# Patient Record
Sex: Male | Born: 1990 | Race: Black or African American | Hispanic: No | Marital: Single | Smoking: Current every day smoker
Health system: Southern US, Community
[De-identification: ages and names within clinical notes are randomized; demographics above are authoritative.]

---
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
## 2014-01-26 ENCOUNTER — Emergency Department (HOSPITAL_COMMUNITY): Payer: Self-pay

## 2014-01-26 ENCOUNTER — Emergency Department (HOSPITAL_COMMUNITY)
Admission: EM | Admit: 2014-01-26 | Discharge: 2014-01-26 | Disposition: A | Discharge status: Home / Self Care | Payer: Self-pay | Attending: Emergency Medicine | Admitting: Emergency Medicine

## 2014-01-26 ENCOUNTER — Encounter (HOSPITAL_COMMUNITY): Payer: Self-pay | Admitting: Emergency Medicine

## 2014-01-26 DIAGNOSIS — Y9289 Other specified places as the place of occurrence of the external cause: Secondary | ICD-10-CM | POA: Insufficient documentation

## 2014-01-26 DIAGNOSIS — Y9389 Activity, other specified: Secondary | ICD-10-CM | POA: Insufficient documentation

## 2014-01-26 DIAGNOSIS — S6990XA Unspecified injury of unspecified wrist, hand and finger(s), initial encounter: Secondary | ICD-10-CM | POA: Insufficient documentation

## 2014-01-26 DIAGNOSIS — S60229A Contusion of unspecified hand, initial encounter: Secondary | ICD-10-CM | POA: Insufficient documentation

## 2014-01-26 DIAGNOSIS — K029 Dental caries, unspecified: Secondary | ICD-10-CM | POA: Insufficient documentation

## 2014-01-26 DIAGNOSIS — S60221A Contusion of right hand, initial encounter: Secondary | ICD-10-CM

## 2014-01-26 DIAGNOSIS — W230XXA Caught, crushed, jammed, or pinched between moving objects, initial encounter: Secondary | ICD-10-CM | POA: Insufficient documentation

## 2014-01-26 DIAGNOSIS — F17200 Nicotine dependence, unspecified, uncomplicated: Secondary | ICD-10-CM | POA: Insufficient documentation

## 2014-01-26 MED ORDER — NAPROXEN 375 MG PO TABS: 375.0000 mg | ORAL_TABLET | Freq: Two times a day (BID) | ORAL | Status: DC

## 2014-01-26 MED ORDER — AMOXICILLIN 500 MG PO CAPS: 500.0000 mg | ORAL_CAPSULE | Freq: Three times a day (TID) | ORAL | Status: DC

## 2014-01-26 MED ORDER — ONDANSETRON 4 MG PO TBDP
4.0000 mg | ORAL_TABLET | Freq: Once | ORAL | Status: AC
  Administered 2014-01-26: 4 mg via ORAL
  Filled 2014-01-26: qty 1

## 2014-01-26 MED ORDER — HYDROCODONE-ACETAMINOPHEN 5-325 MG PO TABS
1.0000 | ORAL_TABLET | Freq: Once | ORAL | Status: AC
  Administered 2014-01-26: 1 via ORAL
  Filled 2014-01-26: qty 1

## 2014-01-26 NOTE — ED Notes (Signed)
Pt c/o left hand pain and dental pain. Nad, skin warm and dry, resp e/u.

## 2014-01-26 NOTE — Discharge Instructions (Signed)
Dental Pain A tooth ache may be caused by cavities (tooth decay). Cavities expose the nerve of the tooth to air and hot or cold temperatures. It may come from an infection or abscess (also called a boil or furuncle) around your tooth. It is also often caused by dental caries (tooth decay). This causes the pain you are having. DIAGNOSIS  Your caregiver can diagnose this problem by exam. TREATMENT   If caused by an infection, it may be treated with medications which kill germs (antibiotics) and pain medications as prescribed by your caregiver. Take medications as directed.  Only take over-the-counter or prescription medicines for pain, discomfort, or fever as directed by your caregiver.  Whether the tooth ache today is caused by infection or dental disease, you should see your dentist as soon as possible for further care. SEEK MEDICAL CARE IF: The exam and treatment you received today has been provided on an emergency basis only. This is not a substitute for complete medical or dental care. If your problem worsens or new problems (symptoms) appear, and you are unable to meet with your dentist, call or return to this location. SEEK IMMEDIATE MEDICAL CARE IF:   You have a fever.  You develop redness and swelling of your face, jaw, or neck.  You are unable to open your mouth.  You have severe pain uncontrolled by pain medicine. MAKE SURE YOU:   Understand these instructions.  Will watch your condition.  Will get help right away if you are not doing well or get worse. Document Released: 05/29/2005 Document Revised: 08/21/2011 Document Reviewed: 01/15/2008 [facility] Patient Information 2015 [place], [place]. This information is not intended to replace advice given to you by your health care provider. Make sure you discuss any questions you have with your health care provider.  Hand Contusion A hand contusion is a deep bruise on your hand area. Contusions are the result of an injury that  caused bleeding under the skin. The contusion may turn blue, purple, or yellow. Minor injuries will give you a painless contusion, but more severe contusions may stay painful and swollen for a few weeks. CAUSES  A contusion is usually caused by a blow, trauma, or direct force to an area of the body. SYMPTOMS   Swelling and redness of the injured area.  Discoloration of the injured area.  Tenderness and soreness of the injured area.  Pain. DIAGNOSIS  The diagnosis can be made by taking a history and performing a physical exam. An X-ray, CT scan, or MRI may be needed to determine if there were any associated injuries, such as broken bones (fractures). TREATMENT  Often, the best treatment for a hand contusion is resting, elevating, icing, and applying cold compresses to the injured area. Over-the-counter medicines may also be recommended for pain control. HOME CARE INSTRUCTIONS   Put ice on the injured area.  Put ice in a plastic bag.  Place a towel between your skin and the bag.  Leave the ice on for 15-20 minutes, 03-04 times a day.  Only take over-the-counter or prescription medicines as directed by your caregiver. Your caregiver may recommend avoiding anti-inflammatory medicines (aspirin, ibuprofen, and naproxen) for 48 hours because these medicines may increase bruising.  If told, use an elastic wrap as directed. This can help reduce swelling. You may remove the wrap for sleeping, showering, and bathing. If your fingers become numb, cold, or blue, take the wrap off and reapply it more loosely.  Elevate your hand with pillows to  reduce swelling.  Avoid overusing your hand if it is painful. SEEK IMMEDIATE MEDICAL CARE IF:   You have increased redness, swelling, or pain in your hand.  Your swelling or pain is not relieved with medicines.  You have loss of feeling in your hand or are unable to move your fingers.  Your hand turns cold or blue.  You have pain when you move your  fingers.  Your hand becomes warm to the touch.  Your contusion does not improve in 2 days. MAKE SURE YOU:   Understand these instructions.  Will watch your condition.  Will get help right away if you are not doing well or get worse. Document Released: 11/18/2001 Document Revised: 02/21/2012 Document Reviewed: 11/20/2011 [facility] Patient Information 2015 [place]. This information is not intended to replace advice given to you by your health care provider. Make sure you discuss any questions you have with your health care provider.

## 2014-01-26 NOTE — ED Provider Notes (Signed)
CSN: [id number]     Arrival date & time 01/26/14  1404 History  This chart was scribed for non-physician practitioner, Wyatt Peliffany Janeene Sand, PA-C working with Purvis SheffieldForrest Harrison, MD by Wyatt StallionKayla Farley, ED scribe. This patient was seen in room TR10C/TR10C and the patient's care was started at 3:26 PM.    Chief Complaint  Patient presents with  . Hand Pain  . Dental Pain   The history is provided by the patient. No language interpreter was used.   HPI Comments: Wyatt Farley is a 23 y.o. male who presents to the Emergency Department complaining of left hand injury that occurred today at 10 AM. States he slammed his hand in the car door. Reports sudden onset pain with associated swelling that is worse around his thumb. Certain movements of his thumb worsen the pain. Pt is also complaining of right lower dental pain. States he had a filling in his tooth that fell out.   History reviewed. No pertinent past medical history. History reviewed. No pertinent past surgical history. No family history on file. History  Substance Use Topics  . Smoking status: Current Every Day Smoker -- 1.00 packs/day    Types: Cigarettes  . Smokeless tobacco: Not on file  . Alcohol Use: Yes    Review of Systems  HENT: Positive for dental problem.   Musculoskeletal: Positive for arthralgias.  All other systems reviewed and are negative.  Allergies  Review of patient's allergies indicates no known allergies.  Home Medications   Prior to Admission medications   Medication Sig Start Date End Date Taking? Authorizing Provider  amoxicillin (AMOXIL) 500 MG capsule Take 1 capsule (500 mg total) by mouth 3 (three) times daily. 01/26/14   Wyatt Hodsdon Irine SealG Toya Palacios, PA-C  naproxen (NAPROSYN) 375 MG tablet Take 1 tablet (375 mg total) by mouth 2 (two) times daily. 01/26/14   Wyatt Lamia Irine SealG Seward Coran, PA-C   BP 141/91  Pulse 58  Temp(Src) 98.1 F (36.7 C) (Oral)  Resp 18  Ht 5\' 4"  (1.626 m)  Wt 130 lb (58.968 kg)  BMI 22.30 kg/m2  SpO2  100%  Physical Exam  Nursing note and vitals reviewed. Constitutional: He is oriented to person, place, and time. He appears well-developed and well-nourished. No distress.  HENT:  Head: Normocephalic and atraumatic.  Mouth/Throat: Uvula is midline, oropharynx is clear and moist and mucous membranes are normal. No trismus in the jaw. Dental caries present. No uvula swelling.  Hole in right lower last molar.   Eyes: Conjunctivae and EOM are normal. Pupils are equal, round, and reactive to light.  Neck: Normal range of motion. Neck supple. No tracheal deviation present.  Cardiovascular: Normal rate and regular rhythm.   Pulmonary/Chest: Effort normal and breath sounds normal. No respiratory distress.  Musculoskeletal: Normal range of motion.  Full ROM but with pain to the left thumb and index finger. Subungual hematoma to right thumbnail. Right wrist normal.   Neurological: He is alert and oriented to person, place, and time.  Skin: Skin is warm and dry.  Psychiatric: He has a normal mood and affect. His behavior is normal.    ED Course  Procedures (including critical care time)  DIAGNOSTIC STUDIES: Oxygen Saturation is 100% on RA, normal by my interpretation.    COORDINATION OF CARE: 3:30 PM-Discussed treatment plan which includes ACE wrap and an antibiotic with pt at bedside and pt agreed to plan. Will give pt dental referrals and advised him to follow up. Return precautions given.   Labs Review Labs Reviewed -  No data to display  Imaging Review Dg Hand Complete Left  01/26/2014   CLINICAL DATA:  LEFT hand pain and swelling along the first and second metacarpals.  EXAM: LEFT HAND - COMPLETE 3+ VIEW  COMPARISON:  None.  FINDINGS: There is no evidence of fracture or dislocation. There is no evidence of arthropathy or other focal bone abnormality. Soft tissues are unremarkable.  IMPRESSION: Negative.   Electronically Signed   By: Wyatt Farley M.D.   On: 01/26/2014 15:17     EKG  Interpretation None      MDM   Final diagnoses:  Dental caries  Hand contusion, right, initial encounter    23 y.o.[name]'s evaluation in the Emergency Department is complete. It has been determined that no acute conditions requiring further emergency intervention are present at this time. The patient/guardian have been advised of the diagnosis and plan. We have discussed signs and symptoms that warrant return to the ED, such as changes or worsening in symptoms.  Vital signs are stable at discharge. Filed Vitals:   01/26/14 1550  BP: 141/91  Pulse: 58  Temp: 98.1 F (36.7 C)  Resp: 18    Patient/guardian has voiced understanding and agreed to follow-up with the PCP or specialist.  I personally performed the services described in this documentation, which was scribed in my presence. The recorded information has been reviewed and is accurate.  Dorthula Matas, PA-C 01/27/14 1616

## 2014-01-28 NOTE — ED Provider Notes (Signed)
Medical screening examination/treatment/procedure(s) were performed by non-physician practitioner and as supervising physician I was immediately available for consultation/collaboration.   EKG Interpretation None        Tenishia Ekman, MD 01/28/14 1131 

## 2014-06-16 ENCOUNTER — Ambulatory Visit (HOSPITAL_COMMUNITY)
Admission: RE | Admit: 2014-06-16 | Discharge: 2014-06-16 | Disposition: A | Discharge status: Home / Self Care | Payer: Self-pay | Source: Ambulatory Visit | Attending: Obstetrics and Gynecology | Admitting: Obstetrics and Gynecology

## 2014-06-16 DIAGNOSIS — Z31448 Encounter for other genetic testing of male for procreative management: Secondary | ICD-10-CM | POA: Insufficient documentation

## 2014-06-16 LAB — ROUTINE CHROMOSOME - KARYOTYPE

## 2014-06-25 ENCOUNTER — Telehealth (HOSPITAL_COMMUNITY): Payer: Self-pay | Admitting: MS"

## 2014-06-25 ENCOUNTER — Other Ambulatory Visit (HOSPITAL_COMMUNITY): Payer: Self-pay

## 2014-06-25 NOTE — Telephone Encounter (Signed)
Called Ms. Audelia ActonMakalia Fields, partner of Mr. Ky BarbanRaheem Carne regarding results of peripheral blood chromosome analysis. This testing was offered given a history of recurrent miscarriage for the couple. He was identified by name and DOB. Discussed with Ms. 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   Clydie BraunKaren Teairra Millar 06/25/2014 10:54 AM

## 2015-06-25 ENCOUNTER — Emergency Department (HOSPITAL_COMMUNITY)
Admission: EM | Admit: 2015-06-25 | Discharge: 2015-06-25 | Disposition: A | Discharge status: Home / Self Care | Payer: Medicaid Other | Attending: Emergency Medicine | Admitting: Emergency Medicine

## 2015-06-25 ENCOUNTER — Encounter (HOSPITAL_COMMUNITY): Payer: Self-pay

## 2015-06-25 DIAGNOSIS — Z791 Long term (current) use of non-steroidal anti-inflammatories (NSAID): Secondary | ICD-10-CM | POA: Insufficient documentation

## 2015-06-25 DIAGNOSIS — K029 Dental caries, unspecified: Secondary | ICD-10-CM | POA: Insufficient documentation

## 2015-06-25 DIAGNOSIS — K0889 Other specified disorders of teeth and supporting structures: Secondary | ICD-10-CM | POA: Insufficient documentation

## 2015-06-25 DIAGNOSIS — F17210 Nicotine dependence, cigarettes, uncomplicated: Secondary | ICD-10-CM | POA: Insufficient documentation

## 2015-06-25 DIAGNOSIS — Z792 Long term (current) use of antibiotics: Secondary | ICD-10-CM | POA: Insufficient documentation

## 2015-06-25 MED ORDER — PENICILLIN V POTASSIUM 500 MG PO TABS: 500.0000 mg | ORAL_TABLET | Freq: Four times a day (QID) | ORAL | Status: AC

## 2015-06-25 NOTE — ED Provider Notes (Signed)
CSN: [id number]     Arrival date & time 06/25/15  1405 History  By signing my name below, I, Wyatt Farley, attest that this documentation has been prepared under the direction and in the presence of Alveta Heimlich, PA-C Electronically Signed: Soijett Farley, ED Scribe. 06/25/2015. 3:11 PM.   Chief Complaint  Patient presents with  . Dental Pain   The history is provided by the patient. No language interpreter was used.   Wyatt Farley is a 25 y.o. male who presents to the Emergency Department complaining of right lower dental pain onset 1 year worsening last night. He said that he was having dental pain to the same area since last year. Pt had a filing to the area that had popped out prior to the onset of his symptoms. He reports that his tooth is broken and that he doesn't have a dentist at this time. He has been seen in ED for same complaint but denies following up with a dentist for this. He states that he is having associated symptoms of jaw pain. The pain does not radiate into his neck. He states that has not tried any medications for the relief for his symptoms. Denies fevers, chills, difficulty handling secretions, difficulty breathing, cheek swelling, redness of the cheek, neck stiffness, chest pain, SOB, nausea or vomiting.   History reviewed. No pertinent past medical history. History reviewed. No pertinent past surgical history. No family history on file. Social History  Substance Use Topics  . Smoking status: Current Every Day Smoker -- 1.00 packs/day    Types: Cigarettes  . Smokeless tobacco: None  . Alcohol Use: Yes    Review of Systems  HENT: Positive for dental problem.   All other systems reviewed and are negative.     Allergies  Review of patient's allergies indicates no known allergies.  Home Medications   Prior to Admission medications   Medication Sig Start Date End Date Taking? Authorizing Provider  amoxicillin (AMOXIL) 500 MG capsule Take 1 capsule (500 mg  total) by mouth 3 (three) times daily. 01/26/14   Tiffany Neva Seat, PA-C  naproxen (NAPROSYN) 375 MG tablet Take 1 tablet (375 mg total) by mouth 2 (two) times daily. 01/26/14   Marlon Pel, PA-C  penicillin v potassium (VEETID) 500 MG tablet Take 1 tablet (500 mg total) by mouth 4 (four) times daily. 06/25/15 07/02/15  Jamesetta Greenhalgh, PA-C   BP 118/60 mmHg  Pulse 93  Temp(Src) 100 F (37.8 C) (Oral)  Resp 16  Ht 5\' 5"  (1.651 m)  Wt 58.968 kg  BMI 21.63 kg/m2  SpO2 100% Physical Exam  Constitutional: He appears well-developed and well-nourished. No distress.  HENT:  Head: Normocephalic and atraumatic.  Right Ear: External ear normal.  Left Ear: External ear normal.  Mouth/Throat: Uvula is midline, oropharynx is clear and moist and mucous membranes are normal. Mucous membranes are not dry. No trismus in the jaw. Dental caries present. No dental abscesses or uvula swelling.    Severe dental decay of right lower 3rd molar. Tooth is extremely TTP. No surrounding erythema of the gumline. No obvious dental abscess. No trismus. No erythema or swelling of the cheeks. No erythema, induration or tenderness of sublingual area.   Eyes: Conjunctivae are normal. Right eye exhibits no discharge. Left eye exhibits no discharge. No scleral icterus.  Neck: Normal range of motion. Neck supple.  No TTP of the neck. FROM intact. No soft tissue swelling. No stridor  Cardiovascular: Normal rate.   Pulmonary/Chest: Effort normal.  Musculoskeletal: Normal range of motion.  Moves all extremities spontaneously  Lymphadenopathy:    He has no cervical adenopathy.  Neurological: He is alert. Coordination normal.  Skin: Skin is warm and dry.  Psychiatric: He has a normal mood and affect. His behavior is normal.  Nursing note and vitals reviewed.   ED Course  Procedures (including critical care time) DIAGNOSTIC STUDIES: Oxygen Saturation is 100% on RA, nl by my interpretation.    COORDINATION OF CARE: 3:11  PM Discussed treatment plan with pt at bedside which includes referral to dentist and abx Rx and pt agreed to plan.  Labs Review Labs Reviewed - No data to display  Imaging Review No results found.    EKG Interpretation None      MDM   Final diagnoses:  Pain, dental   Patient presenting with tooth pain. No obvious abscess on exam. No soft tissue swelling of the cheek or neck. Exam unconcerning for Ludwig's angina or spread of infection. Will discharge with penicillin and encouraged pt to use OTC pain relievers, ice and follow-up with dentist. Resource guide given in discharge paperwork. Return precautions discussed with pt and given in discharge paperwork. Stable for discharge.   I personally performed the services described in this documentation, which was scribed in my presence. The recorded information has been reviewed and is accurate.    Alveta HeimlichStevi Keldan Eplin, PA-C 06/25/15 1859  Bethann BerkshireJoseph Zammit, MD 06/26/15 [id number]

## 2015-06-25 NOTE — Discharge Instructions (Signed)
Dental Pain °Dental pain may be caused by many things, including: °· Tooth decay (cavities or caries). Cavities expose the nerve of your tooth to air and hot or cold temperatures. This can cause pain or discomfort. °· Abscess or infection. A dental abscess is a collection of infected pus from a bacterial infection in the inner part of the tooth (pulp). It usually occurs at the end of the tooth's root. °· Injury. °· An unknown reason (idiopathic). °Your pain may be mild or severe. It may only occur when: °· You are chewing. °· You are exposed to hot or cold temperature. °· You are eating or drinking sugary foods or beverages, such as soda or candy. °Your pain may also be constant. °HOME CARE INSTRUCTIONS °Watch your dental pain for any changes. The following actions may help to lessen any discomfort that you are feeling: °· Take medicines only as directed by your dentist. °· If you were prescribed an antibiotic medicine, finish all of it even if you start to feel better. °· Keep all follow-up visits as directed by your dentist. This is important. °· Do not apply heat to the outside of your face. °· Rinse your mouth or gargle with salt water if directed by your dentist. This helps with pain and swelling. °¨ You can make salt water by adding ¼ tsp of salt to 1 cup of warm water. °· Apply ice to the painful area of your face: °¨ Put ice in a plastic bag. °¨ Place a towel between your skin and the bag. °¨ Leave the ice on for 20 minutes, 2-3 times per day. °· Avoid foods or drinks that cause you pain, such as: °¨ Very hot or very cold foods or drinks. °¨ Sweet or sugary foods or drinks. °SEEK MEDICAL CARE IF: °· Your pain is not controlled with medicines. °· Your symptoms are worse. °· You have new symptoms. °SEEK IMMEDIATE MEDICAL CARE IF: °· You are unable to open your mouth. °· You are having trouble breathing or swallowing. °· You have a fever. °· Your face, neck, or jaw is swollen. °  °This information is not  intended to replace advice given to you by your health care provider. Make sure you discuss any questions you have with your health care provider. °  °Document Released: 05/29/2005 Document Revised: 10/13/2014 Document Reviewed: 05/25/2014 °Elsevier Interactive Patient Education ©2016 [employer] Inc[employer] ° °Emergency Department Resource Guide °1) Find a Doctor and Pay Out of Pocket °Although you won't have to find out who is covered by your insurance plan, it is a good idea to ask around and get recommendations. You will then need to call the office and see if the doctor you have chosen will accept you as a new patient and what types of options they offer for patients who are self-pay. Some doctors offer discounts or will set up payment plans for their patients who do not have insurance, but you will need to ask so you aren't surprised when you get to your appointment. ° °2) Contact Your Local Health Department °Not all health departments have doctors that can see patients for sick visits, but many do, so it is worth a call to see if yours does. If you don't know where your local health department is, you can check in your phone book. The CDC also has a tool to help you locate your state's health department, and many state websites also have listings of all of their local health departments. ° °3) Find   a Walk-in Clinic °If your illness is not likely to be very severe or complicated, you may want to try a walk in clinic. These are popping up all over the country in pharmacies, drugstores, and shopping centers. They're usually staffed by nurse practitioners or physician assistants that have been trained to treat common illnesses and complaints. They're usually fairly quick and inexpensive. However, if you have serious medical issues or chronic medical problems, these are probably not your best option. ° °No Primary Care Doctor: °- Call [facility] at  832-8000 - they can help you locate a primary care doctor that   accepts your insurance, provides certain services, etc. °- Physician Referral Service- 1-800-533-3463 ° °Chronic Pain Problems: °Organization         Address  Phone   Notes  °[facility]  ([phone]) [phone] Patients need to be referred by their primary care doctor.  ° °Medication Assistance: °Organization         Address  Phone   Notes  °[place] [facility] [address] °[place], [place] [address] (336) 641-8030 --Must be a resident of [place] °-- Must have NO insurance coverage whatsoever (no Medicaid/ Medicare, etc.) °-- The pt. MUST have a primary care doctor that directs their care regularly and follows them in the community °  °[facility]  (866) 331-1348   °[employer]  (888) 892-1162   ° °Agencies that provide inexpensive medical care: °Organization         Address  Phone   Notes  °[facility]  ([phone]) 832-8035   °[facility]    (336) 832-7272   °[facility] [address] °[place], [place] [address] (336) 832-4777   °[facility] [address], °[place] (336) 271-4999   °[facility]    (336) 373-0678   °[facility]    (336) 272-1050   °[facility] ° [address], [place] Phone:  (336) 832-4444, Fax:  (336) 832-4440 Hours of Operation:  9 am - 6 pm, M-F.  Also accepts Medicaid/Medicare and self-pay.  °[facility] ° [address], [place] Phone: (336) [phone], Fax: (336) [phone]. Hours of Operation:  8:30 am - 5:30 pm, M-F.  Also accepts Medicaid and self-pay.  °[facility] [place] [address], [place] Phone: (336) [phone]   °[facility] [address], [place], [place] (336)723-1848, Ext. 123 Mondays & Thursdays: 7-9 AM.  First 15 patients are seen on a first come, first serve basis. °  ° °Medicaid-accepting [place] Providers: ° °Organization          Address  Phone   Notes  °[facility] [address], [place] ([phone]) [phone] Also accepts self-pay patients.  °[facility] [address], [place] ° (336) [phone]   °[facility] [address], [place] (336) [phone]   °[facility] [address] [place] [address], [place] (336) [phone]   °[facility] [address], [place]  ° (336) [phone] Only accepts [place] Access Medicaid patients after they have their name applied to their card.  ° °Self-Pay (no insurance) in [place]: ° °Organization         Address  Phone   Notes  °Sickle Cell Patients, [facility] [address], [place] (336) [phone]   °[facility] [address], [place] (336) [phone]   ° [facility] ° [address], [place] (336) [phone]   °[facility]/Dr. Osei-Bonsu ° [address] [place] [address], [place] or [address], [place] ([phone]) [phone] Phone number for both [place] and [place] locations is the same.  °[facility] [address] [place] [address], [place] (336) [phone]   °[facility] [address] [place] [address], [place] or [address] (336) 852-7530 °(336) 878-2260   °[facility] [address], [place] (336) 350-1642, phone; (336) 294-5005, fax Sees patients 1st and 3rd Saturday of every month.  Must not qualify for public or private insurance (i.e. Medicaid, Medicare, [place] Health Choice, Veterans' Benefits) • Household income should be no more than 200% of the poverty level •The clinic cannot treat you if you are pregnant or think you are pregnant • Sexually transmitted diseases are not treated at the clinic.  ° ° °Dental Care: °Organization         Address  Phone  Notes  °[facility] [address],  [place] (336) [phone] Accepts children up to age 21 who are enrolled in Medicaid or [place] Health Choice; pregnant women with a Medicaid card; and children who have applied for Medicaid or [place] Health Choice, but were declined, whose parents can pay a reduced fee at time of service.  °[facility] [place]  [address], [place] (336) 641-7733 Accepts children up to age 21 who are enrolled in Medicaid or [place] Health Choice; pregnant women with a Medicaid card; and children who have applied for Medicaid or [place] Health Choice, but were declined, whose parents can pay a reduced fee at time of service.  °[facility] ° [address],  (336) 641-4533 Patients are seen by appointment only. Walk-ins are not accepted. [facility] will see patients 18 years of age and older. °Monday - Tuesday (8am-5pm) °Most Wednesdays (8:30-5pm) °$30 per visit, cash only  °[facility] ° [address], [place] (336) [phone] Patients are seen by appointment only. Walk-ins are not accepted. [facility] will see patients 18 years of age and older. °One Wednesday Evening (Monthly: Volunteer Based).  $30 per visit, cash only  °[facility]  ([phone]) [phone] for adults; Children under age 4, call Graduate Pediatric Dentistry at ([phone]) [phone]. Children aged 4-14, please call ([phone]) [phone] to request a pediatric application. ° Dental services are provided in all areas of dental care including fillings, crowns and bridges, complete and partial dentures, implants, gum treatment, root canals, and extractions. Preventive care is also provided. Treatment is provided to both adults and children. °Patients are selected via a lottery and there is often a waiting list. °  °[facility] [address], ° ° (336) 763-8833 [email or web address] °  °[facility] [address], [place], [place]  (336)723-1848, Ext. 123 Second and Fourth Thursday of each month, opens at 6:30 AM; Clinic ends at 9 AM.  Patients are seen on a first-come first-served basis, and a limited number are seen during each clinic.  ° °[facility] ° [address], [place], [place] (336) [phone]   Eligibility Requirements °You must have lived in [place], [place], or   [place] counties for at least the last three months. °  You cannot be eligible for state or federal sponsored healthcare insurance, including Veterans Administration, Medicaid, or Medicare. °  You generally cannot be eligible for healthcare insurance through your employer.  °  How to apply: °Eligibility screenings are held every Tuesday and Wednesday afternoon from 1:00 pm until 4:00 pm. You do not need an appointment for the interview!  °[facility] [address], [place], [place] [phone]   °[facility]  [phone]   °[facility]  [phone]   °[facility]  [phone]   ° °

## 2015-06-25 NOTE — ED Notes (Signed)
Patient here with right lower dental pain, reports tooth is broken.

## 2016-08-18 ENCOUNTER — Ambulatory Visit (HOSPITAL_COMMUNITY)
Admission: EM | Admit: 2016-08-18 | Discharge: 2016-08-18 | Disposition: A | Discharge status: Home / Self Care | Payer: Medicaid Other | Attending: Emergency Medicine | Admitting: Emergency Medicine

## 2016-08-18 ENCOUNTER — Encounter (HOSPITAL_COMMUNITY): Payer: Self-pay | Admitting: *Deleted

## 2016-08-18 DIAGNOSIS — Z202 Contact with and (suspected) exposure to infections with a predominantly sexual mode of transmission: Secondary | ICD-10-CM | POA: Insufficient documentation

## 2016-08-18 DIAGNOSIS — F17210 Nicotine dependence, cigarettes, uncomplicated: Secondary | ICD-10-CM | POA: Insufficient documentation

## 2016-08-18 MED ORDER — AZITHROMYCIN 250 MG PO TABS
ORAL_TABLET | ORAL | Status: AC
  Filled 2016-08-18: qty 4

## 2016-08-18 MED ORDER — AZITHROMYCIN 250 MG PO TABS
1000.0000 mg | ORAL_TABLET | Freq: Once | ORAL | Status: AC
  Administered 2016-08-18: 1000 mg via ORAL

## 2016-08-18 MED ORDER — LIDOCAINE HCL (PF) 1 % IJ SOLN
INTRAMUSCULAR | Status: AC
  Filled 2016-08-18: qty 2

## 2016-08-18 MED ORDER — CEFTRIAXONE SODIUM 250 MG IJ SOLR
INTRAMUSCULAR | Status: AC
  Filled 2016-08-18: qty 250

## 2016-08-18 MED ORDER — CEFTRIAXONE SODIUM 250 MG IJ SOLR
250.0000 mg | Freq: Once | INTRAMUSCULAR | Status: AC
  Administered 2016-08-18: 250 mg via INTRAMUSCULAR

## 2016-08-18 NOTE — ED Triage Notes (Signed)
Pt  Denies  Any  Symptoms  He  States   He  Was   Exposed  To an  Std     He  States  He  Was  Told  By  His  Partner

## 2016-08-18 NOTE — Discharge Instructions (Signed)
You're being screened today for gonorrhea, and for chlamydia. Your urine will be sent for testing, and you'll be notified of the results in 3-5 business days. You're being treated today with 1 g of azithromycin, an injection of 250 mg of ceftriaxone. I recommend abstinence for the next 7 days, or practicing safe sex practices with the use of condoms. I also recommend you follow-up with either your primary care provider, Public health, or return to clinic in 7 days for rescreening to ensure clearance of your infection.

## 2016-08-18 NOTE — ED Provider Notes (Signed)
CSN: [id number]     Arrival date & time 08/18/16  1050 History   First MD Initiated Contact with Patient 08/18/16 1215     Chief Complaint  Patient presents with  . Exposure to STD   (Consider location/radiation/quality/duration/timing/severity/associated sxs/prior Treatment) 26 year old male presents to clinic today for treatment of an evaluation for exposure to chlamydia. He reports his partner was tested 2 days ago during routine prenatal screening and she tested positive for chlamydia. The patient denies any symptoms. He has had no fevers, chills, nausea, vomiting, diarrhea, abdominal pain, dysuria, penile discharge, skin lesions or rashes, chest pain, shortness of breath, weakness, dizziness, or other complaints such as swelling in his hands, feet, ankles.  Social history significant for smoking, and for alcohol use, he denies any past medical history, and he denies family history of diabetes, coronary artery disease, or CVA.   The history is provided by the patient.  Exposure to STD     History reviewed. No pertinent past medical history. History reviewed. No pertinent surgical history. History reviewed. No pertinent family history. Social History  Substance Use Topics  . Smoking status: Current Every Day Smoker    Packs/day: 1.00    Types: Cigarettes  . Smokeless tobacco: Not on file  . Alcohol use Yes    Review of Systems  Reason unable to perform ROS: as covered in HPI.  All other systems reviewed and are negative.   Allergies  Patient has no known allergies.  Home Medications   Prior to Admission medications   Not on File   Meds Ordered and Administered this Visit   Medications  cefTRIAXone (ROCEPHIN) injection 250 mg (250 mg Intramuscular Given 08/18/16 1235)  azithromycin (ZITHROMAX) tablet 1,000 mg (1,000 mg Oral Given 08/18/16 1235)    BP 128/62 (BP Location: Right Arm)   Pulse 78   Temp 99.6 F (37.6 C) (Oral)   Resp 18   SpO2 100%  No data  found.   Physical Exam  Constitutional: He is oriented to person, place, and time. He appears well-developed and well-nourished. No distress.  Cardiovascular: Normal rate and regular rhythm.   Pulmonary/Chest: Effort normal and breath sounds normal.  Abdominal: Soft. Bowel sounds are normal. He exhibits no distension. There is no tenderness. There is no guarding.  Genitourinary:  Genitourinary Comments: Deferred, urine cytology collected  Musculoskeletal: He exhibits no edema or tenderness.  Neurological: He is alert and oriented to person, place, and time.  Skin: Skin is warm and dry. Capillary refill takes less than 2 seconds. He is not diaphoretic.  Psychiatric: He has a normal mood and affect. His behavior is normal.  Nursing note and vitals reviewed.   Urgent Care Course     Procedures (including critical care time)  Labs Review Labs Reviewed  URINE CYTOLOGY ANCILLARY ONLY    Imaging Review No results found.    MDM   1. Exposure to STD    You're being screened today for gonorrhea, and for chlamydia. Your urine will be sent for testing, and you'll be notified of the results in 3-5 business days. You're being treated today with 1 g of azithromycin, an injection of 250 mg of ceftriaxone. I recommend abstinence for the next 7 days, or practicing safe sex practices with the use of condoms. I also recommend you follow-up with either your primary care provider, Public health, or return to clinic in 7 days for rescreening to ensure clearance of your infection.     Dorena BodoLawrence Shyan Scalisi, NP 08/18/16  2122  

## 2016-08-21 LAB — URINE CYTOLOGY ANCILLARY ONLY
Chlamydia: NEGATIVE
Neisseria Gonorrhea: NEGATIVE

## 2021-07-30 ENCOUNTER — Other Ambulatory Visit: Payer: Self-pay

## 2021-07-30 ENCOUNTER — Emergency Department (HOSPITAL_COMMUNITY)
Admission: EM | Admit: 2021-07-30 | Discharge: 2021-07-30 | Disposition: A | Discharge status: Home / Self Care | Payer: Medicaid Other | Attending: Emergency Medicine | Admitting: Emergency Medicine

## 2021-07-30 ENCOUNTER — Emergency Department (HOSPITAL_COMMUNITY): Payer: Medicaid Other

## 2021-07-30 ENCOUNTER — Encounter (HOSPITAL_COMMUNITY): Payer: Self-pay | Admitting: Emergency Medicine

## 2021-07-30 DIAGNOSIS — K610 Anal abscess: Secondary | ICD-10-CM | POA: Insufficient documentation

## 2021-07-30 DIAGNOSIS — K6289 Other specified diseases of anus and rectum: Secondary | ICD-10-CM | POA: Diagnosis present

## 2021-07-30 DIAGNOSIS — D72829 Elevated white blood cell count, unspecified: Secondary | ICD-10-CM | POA: Insufficient documentation

## 2021-07-30 DIAGNOSIS — R7309 Other abnormal glucose: Secondary | ICD-10-CM | POA: Insufficient documentation

## 2021-07-30 DIAGNOSIS — R7989 Other specified abnormal findings of blood chemistry: Secondary | ICD-10-CM | POA: Insufficient documentation

## 2021-07-30 DIAGNOSIS — R000 Tachycardia, unspecified: Secondary | ICD-10-CM | POA: Diagnosis not present

## 2021-07-30 LAB — COMPREHENSIVE METABOLIC PANEL
ALT: 14 U/L (ref 0–44)
AST: 18 U/L (ref 15–41)
Albumin: 4.1 g/dL (ref 3.5–5.0)
Alkaline Phosphatase: 83 U/L (ref 38–126)
Anion gap: 10 (ref 5–15)
BUN: 7 mg/dL (ref 6–20)
CO2: 26 mmol/L (ref 22–32)
Calcium: 9.8 mg/dL (ref 8.9–10.3)
Chloride: 99 mmol/L (ref 98–111)
Creatinine, Ser: 0.99 mg/dL (ref 0.61–1.24)
GFR, Estimated: >60 mL/min (ref >60)
Glucose, Bld: 138 mg/dL — ABNORMAL HIGH (ref 70–99)
Potassium: 3.7 mmol/L (ref 3.5–5.1)
Sodium: 135 mmol/L (ref 135–145)
Total Bilirubin: 0.7 mg/dL (ref 0.3–1.2)
Total Protein: 8.1 g/dL (ref 6.5–8.1)

## 2021-07-30 LAB — CBC WITH DIFFERENTIAL/PLATELET
Abs Immature Granulocytes: 0.06 10*3/uL (ref 0.00–0.07)
Basophils Absolute: 0 10*3/uL (ref 0.0–0.1)
Basophils Relative: 0 %
Eosinophils Absolute: 0 10*3/uL (ref 0.0–0.5)
Eosinophils Relative: 0 %
HCT: 45 % (ref 39.0–52.0)
Hemoglobin: 15.4 g/dL (ref 13.0–17.0)
Immature Granulocytes: 0 %
Lymphocytes Relative: 10 %
Lymphs Abs: 1.4 10*3/uL (ref 0.7–4.0)
MCH: 30.6 pg (ref 26.0–34.0)
MCHC: 34.2 g/dL (ref 30.0–36.0)
MCV: 89.3 fL (ref 80.0–100.0)
Monocytes Absolute: 1.2 10*3/uL — ABNORMAL HIGH (ref 0.1–1.0)
Monocytes Relative: 8 %
Neutro Abs: 12.3 10*3/uL — ABNORMAL HIGH (ref 1.7–7.7)
Neutrophils Relative %: 82 %
Platelets: 264 10*3/uL (ref 150–400)
RBC: 5.04 MIL/uL (ref 4.22–5.81)
RDW: 12.4 % (ref 11.5–15.5)
WBC: 15 10*3/uL — ABNORMAL HIGH (ref 4.0–10.5)
nRBC: 0 % (ref 0.0–0.2)

## 2021-07-30 LAB — LACTIC ACID, PLASMA: Lactic Acid, Venous: 1.6 mmol/L (ref 0.5–1.9)

## 2021-07-30 MED ORDER — ONDANSETRON HCL 4 MG/2ML IJ SOLN
4.0000 mg | Freq: Once | INTRAMUSCULAR | Status: AC
  Administered 2021-07-30: 4 mg via INTRAVENOUS
  Filled 2021-07-30: qty 2

## 2021-07-30 MED ORDER — AMOXICILLIN-POT CLAVULANATE 875-125 MG PO TABS
1.0000 | ORAL_TABLET | Freq: Once | ORAL | Status: AC
  Administered 2021-07-30: 1 via ORAL
  Filled 2021-07-30: qty 1

## 2021-07-30 MED ORDER — ACETAMINOPHEN 325 MG PO TABS
650.0000 mg | ORAL_TABLET | Freq: Once | ORAL | Status: AC
  Administered 2021-07-30: 650 mg via ORAL
  Filled 2021-07-30: qty 2

## 2021-07-30 MED ORDER — MORPHINE SULFATE (PF) 4 MG/ML IV SOLN
4.0000 mg | Freq: Once | INTRAVENOUS | Status: AC
  Administered 2021-07-30: 4 mg via INTRAVENOUS
  Filled 2021-07-30: qty 1

## 2021-07-30 MED ORDER — LIDOCAINE-EPINEPHRINE (PF) 2 %-1:200000 IJ SOLN
20.0000 mL | Freq: Once | INTRAMUSCULAR | Status: AC
  Administered 2021-07-30: 20 mL
  Filled 2021-07-30: qty 20

## 2021-07-30 MED ORDER — AMOXICILLIN-POT CLAVULANATE 875-125 MG PO TABS: 1.0000 | ORAL_TABLET | Freq: Two times a day (BID) | ORAL | 0 refills | Status: AC

## 2021-07-30 MED ORDER — IOHEXOL 300 MG/ML  SOLN
100.0000 mL | Freq: Once | INTRAMUSCULAR | Status: AC | PRN
  Administered 2021-07-30: 100 mL via INTRAVENOUS

## 2021-07-30 MED ORDER — OXYCODONE-ACETAMINOPHEN 5-325 MG PO TABS: 1.0000 | ORAL_TABLET | Freq: Four times a day (QID) | ORAL | 0 refills | Status: AC | PRN

## 2021-07-30 NOTE — ED Provider Notes (Signed)
[facility] Provider Note   CSN: [id number] Arrival date & time: 07/30/21  1050     History  No chief complaint on file.   Wyatt Farley is a 31 y.o. male.  31 year old male presents today for evaluation of rectal pain and swelling of several day duration.  Patient reports he was evaluated in the emergency room on Wednesday and was diagnosed with hemorrhoid and was put on symptomatic treatment.  He reports compliance with the cream he was given as well as stool softeners.  Reports improvement in his chronic constipation.  He states the swelling has been getting worse as well as the pain.  He states that the pain is severe.  He denies fever or chills prior to arrival.  Denies hematochezia, or melanotic stools.  Denies drainage from perirectal region.  He is without abdominal pain, nausea, vomiting.  Denies history of Crohn's disease, ulcerative colitis, diverticulitis or prior perirectal abscess.  The history is provided by the patient. No language interpreter was used.      Home Medications Prior to Admission medications   Not on File      Allergies    Patient has no known allergies.    Review of Systems   Review of Systems  Constitutional:  Negative for chills and fever.  Gastrointestinal:  Positive for rectal pain. Negative for abdominal pain, anal bleeding, blood in stool, nausea and vomiting.  Genitourinary:  Negative for difficulty urinating.  All other systems reviewed and are negative.  Physical Exam Updated Vital Signs BP 130/80 (BP Location: Left Arm)    Pulse (!) 124    Temp (!) 100.5 F (38.1 C) (Oral)    Resp 17    SpO2 100%  Physical Exam Vitals and nursing note reviewed. Exam conducted with a chaperone present.  Constitutional:      General: He is not in acute distress.    Appearance: Normal appearance. He is not ill-appearing.  HENT:     Head: Normocephalic and atraumatic.     Nose: Nose normal.  Eyes:     General:  No scleral icterus.    Extraocular Movements: Extraocular movements intact.     Conjunctiva/sclera: Conjunctivae normal.  Abdominal:     General: There is no distension.     Tenderness: There is no abdominal tenderness. There is no right CVA tenderness, left CVA tenderness or guarding.  Genitourinary:      Comments: Patient on exam has significant perianal swelling to the right of the anus..  This area is exquisitely tender.  Area of fluctuance noted.  Patient refused internal exam. Musculoskeletal:        General: Normal range of motion.     Cervical back: Normal range of motion.  Skin:    General: Skin is warm and dry.  Neurological:     General: No focal deficit present.     Mental Status: He is alert. Mental status is at baseline.    ED Results / Procedures / Treatments   Labs (all labs ordered are listed, but only abnormal results are displayed) Labs Reviewed  LACTIC ACID, PLASMA  LACTIC ACID, PLASMA  COMPREHENSIVE METABOLIC PANEL  CBC WITH DIFFERENTIAL/PLATELET  URINALYSIS, ROUTINE W REFLEX MICROSCOPIC    EKG None  Radiology No results found.  Procedures .Marland KitchenIncision and Drainage  Date/Time: 07/30/2021 2:23 PM Performed by: Evlyn Courier, PA-C Authorized by: Evlyn Courier, PA-C   Consent:    Consent obtained:  Verbal   Consent given by:  Patient   Risks discussed:  Bleeding, incomplete drainage, pain and damage to other organs   Alternatives discussed:  No treatment Universal protocol:    Procedure explained and questions answered to patient or proxy's satisfaction: yes     Relevant documents present and verified: yes     Imaging studies available: yes     Patient identity confirmed:  Verbally with patient and arm band Location:    Type:  Abscess (Perianal abscess)   Size:  5.6 x 3.1 x 5.4 Pre-procedure details:    Skin preparation:  Povidone-iodine Anesthesia:    Anesthesia method:  Local infiltration   Local anesthetic:  Lidocaine 1% WITH epi Procedure  type:    Complexity:  Simple Procedure details:    Incision types:  Single straight   Incision depth:  Subcutaneous   Wound management:  Probed and deloculated, irrigated with saline and extensive cleaning   Drainage:  Purulent and bloody   Drainage amount:  Copious   Packing materials:  1/4 in gauze Post-procedure details:    Procedure completion:  Tolerated well, no immediate complications Comments:     Prior to procedure patient has spontaneous rupture and drainage of the abscess.  Additional straight incision made at the area of spontaneous drainage to complete drainage and packing.    Medications Ordered in ED Medications  acetaminophen (TYLENOL) tablet 650 mg (has no administration in time range)  morphine (PF) 4 MG/ML injection 4 mg (has no administration in time range)  ondansetron (ZOFRAN) injection 4 mg (has no administration in time range)    ED Course/ Medical Decision Making/ A&P                           Medical Decision Making Amount and/or Complexity of Data Reviewed Labs: ordered. Radiology: ordered.  Risk OTC drugs. Prescription drug management.   Medical Decision Making / ED Course   This patient presents to the ED for concern of rectal pain and swelling, this involves an extensive number of treatment options, and is a complaint that carries with it a high risk of complications and morbidity.  The differential diagnosis includes hemorrhoid, perianal abscess, perineal abscess with fistula formation, anal fissure, diverticulitis  MDM: 31 year old male presents today for evaluation of rectal pain and swelling of several day duration.  He was previously evaluated in the emergency room and started on symptomatic treatment for internal hemorrhoids.  He notes improvement in constipation however worsening and rectal pain and swelling.  On exam patient has significant perianal swelling which is exquisitely tender.  He is tachycardic on exam and has a fever of 100.5.   We will obtain blood work, CT abdomen pelvis to further evaluate the abscess for fistula formation.  Will provide pain medicine.  CBC with leukocytosis of 15,000, without anemia, CMP with glucose of 138 otherwise without acute findings.  Lactic acid of 1.6.  CT abdomen pelvis with evidence of peripheral and isolated perianal abscess.  Will drain in the emergency department. I&D complete.  We will provide patient with 5-day course of Augmentin.  Return precautions discussed.  Symptomatic management discussed.  Discussed referral to surgery.  Discussed importance of calling and scheduling an appointment.  Patient voices understanding and is in agreement with plan.  Patient was febrile, had leukocytosis on arrival, and was tachycardic.  Believe the tachycardia was secondary to the significant pain patient was in.  After drainage patient's heart rate improved to 65.  Unlikely that patient  is septic.  Normotensive.  Without endorgan damage.  Additional history obtained: -Additional history obtained from emergency room visit from 4 days ago which was out of network which demonstrated patient had perianal swelling without induration, erythema and was treated for internal hemorrhoid and was prescribed lidocaine-hydrocortisone cream, and stool softeners. -External records from outside source obtained and reviewed including: Chart review including previous notes, labs, imaging, consultation notes   Lab Tests: -I ordered, reviewed, and interpreted labs.   The pertinent results include:   Labs Reviewed  LACTIC ACID, PLASMA  LACTIC ACID, PLASMA  COMPREHENSIVE METABOLIC PANEL  CBC WITH DIFFERENTIAL/PLATELET  URINALYSIS, ROUTINE W REFLEX MICROSCOPIC      EKG  EKG Interpretation  Date/Time:    Ventricular Rate:    PR Interval:    QRS Duration:   QT Interval:    QTC Calculation:   R Axis:     Text Interpretation:           Imaging Studies ordered: I ordered imaging studies including CT  abdomen pelvis I independently visualized and interpreted imaging. I agree with the radiologist interpretation   Medicines ordered and prescription drug management: Meds ordered this encounter  Medications   acetaminophen (TYLENOL) tablet 650 mg   morphine (PF) 4 MG/ML injection 4 mg   ondansetron (ZOFRAN) injection 4 mg    -I have reviewed the patients home medicines and have made adjustments as needed  Critical interventions Incision and drainage  Cardiac Monitoring: The patient was maintained on a cardiac monitor.  I personally viewed and interpreted the cardiac monitored which showed an underlying rhythm of: Initially sinus tach improved to normal sinus rhythm.  Reevaluation: After the interventions noted above, I reevaluated the patient and found that they have :improved Vitals:   07/30/21 1058 07/30/21 1430  BP: 130/80 115/68  Pulse: (!) 124 65  Temp: (!) 100.5 F (38.1 C) 99.8 F (37.7 C)  Resp: 17 17  SpO2: 100% 98%  TempSrc: Oral Oral      Co morbidities that complicate the patient evaluation History reviewed. No pertinent past medical history.    Dispostion: Patient is appropriate for discharge.  Surgery referral given.  Antibiotics provided.  Percocet provided on discharge.  Return precautions discussed.    Final Clinical Impression(s) / ED Diagnoses Final diagnoses:  Perianal abscess    Rx / DC Orders ED Discharge Orders          Ordered    amoxicillin-clavulanate (AUGMENTIN) 875-125 MG tablet  Every 12 hours        07/30/21 1434    oxyCODONE-acetaminophen (PERCOCET/ROXICET) 5-325 MG tablet  Every 6 hours PRN        07/30/21 1434              Evlyn Courier, PA-C 07/30/21 1442    Godfrey Pick, MD 07/30/21 2236

## 2021-07-30 NOTE — ED Triage Notes (Signed)
Pt reports internal hemorrhoids since Wednesday that are now prolapsed and bleeding.

## 2021-07-30 NOTE — Discharge Instructions (Addendum)
You had an abscess that was drained today.  He also had packing placed within the abscess.  You can remove this in 2 to 3 days.  If it falls out before then on its own do not be concerned.  If you have signs of infection including increased drainage from the area, ongoing fever, worsening redness please return for evaluation.  I have given you a referral to surgery office.  Please call them Monday and have your appointment scheduled.  It is important to follow-up with the surgery team after you have an abscess around the rectum. I have sent an antibiotic to the pharmacy for you.  You received your first dose in the emergency room.  Take your next dose tonight.

## 2022-06-23 IMAGING — CT CT ABD-PELV W/ CM
2 of 4 series · 15 of 46 positions shown, 17 images · IV contrast (APPLIED)
Comparison: None.

CLINICAL DATA: Abdominal pain.  Perianal abscess

EXAM:
CT ABDOMEN AND PELVIS WITH CONTRAST
TECHNIQUE: Multidetector CT imaging of the abdomen and pelvis was performed
using the standard protocol following bolus administration of
intravenous contrast.

[Series 3: abdomen 5.0 · axial · 0.67mm/px · z∈[-1133,-733]mm · 12 of 90 slices shown, 14 images]
[im 5/90  soft-tissue]
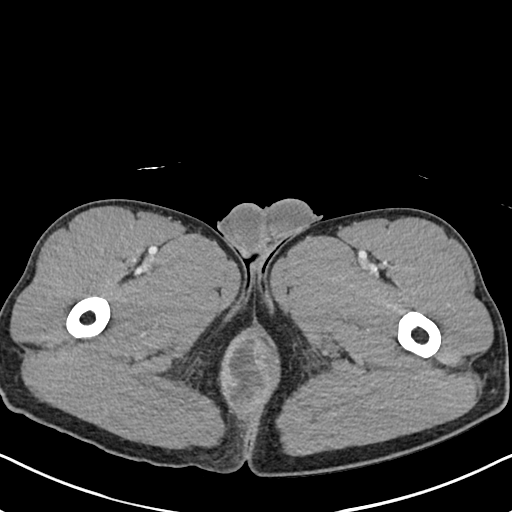
[im 5/90  bone]
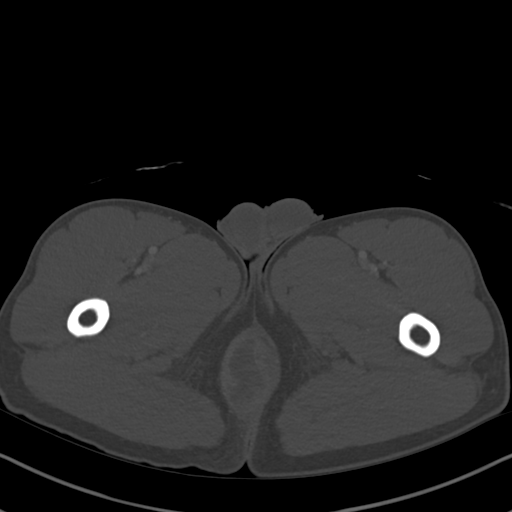
[im 14/90  soft-tissue]
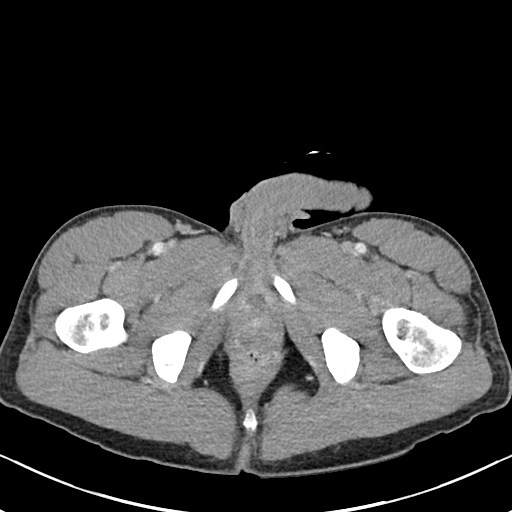
[im 18/90  soft-tissue]
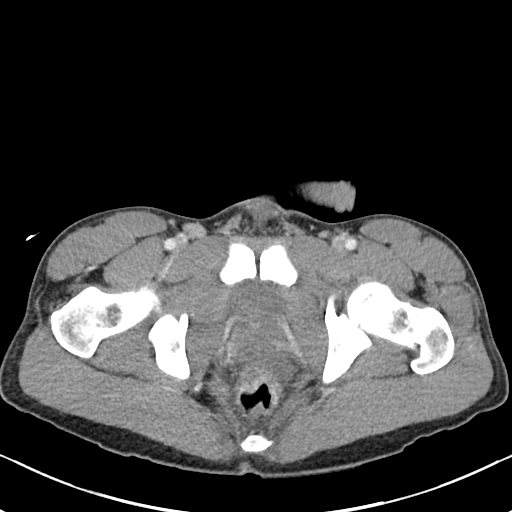
[im 27/90  soft-tissue]
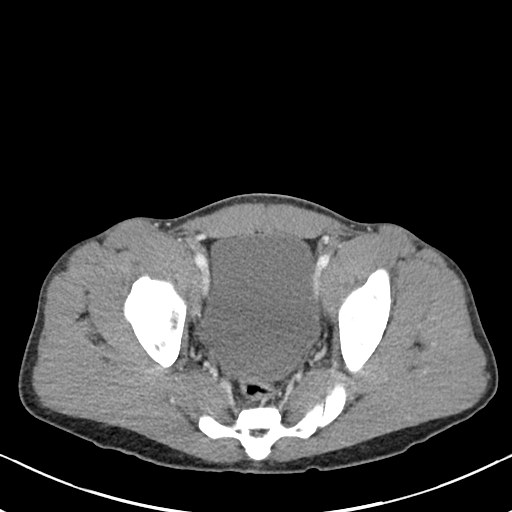
[im 36/90  soft-tissue]
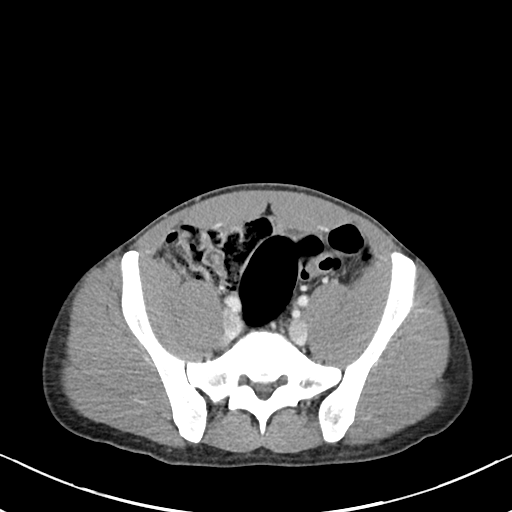
[im 41/90  soft-tissue]
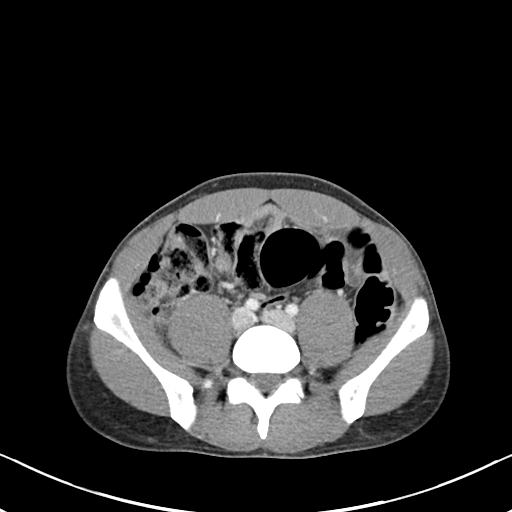
[im 49/90  soft-tissue]
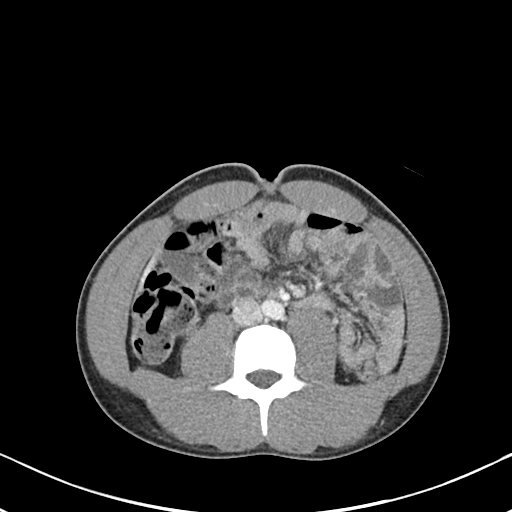
[im 54/90  soft-tissue]
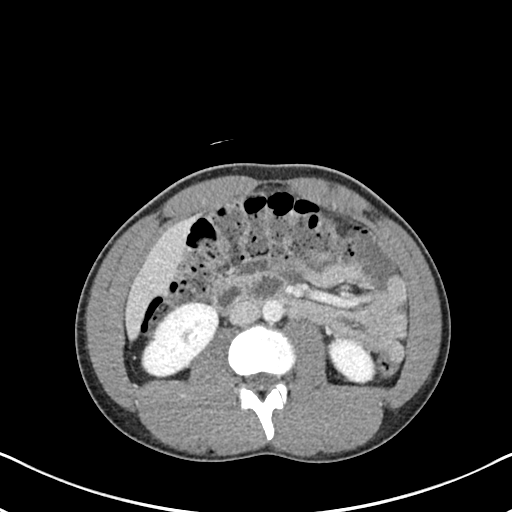
[im 63/90  soft-tissue]
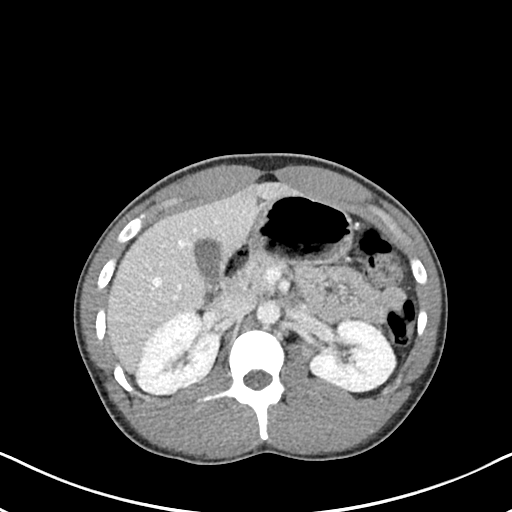
[im 63/90  bone]
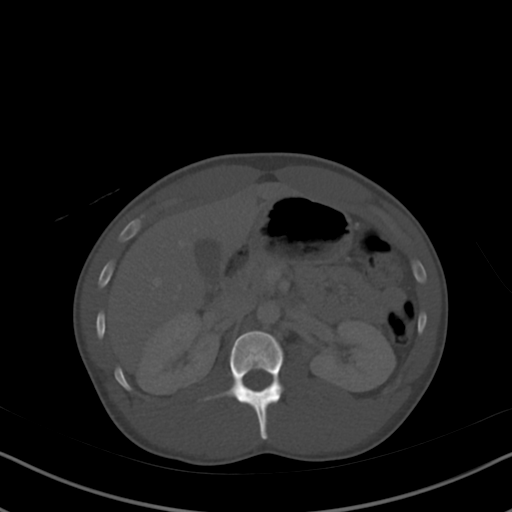
[im 72/90  soft-tissue]
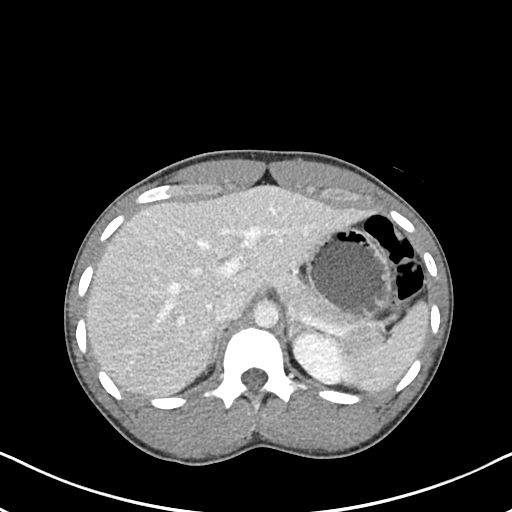
[im 76/90  soft-tissue]
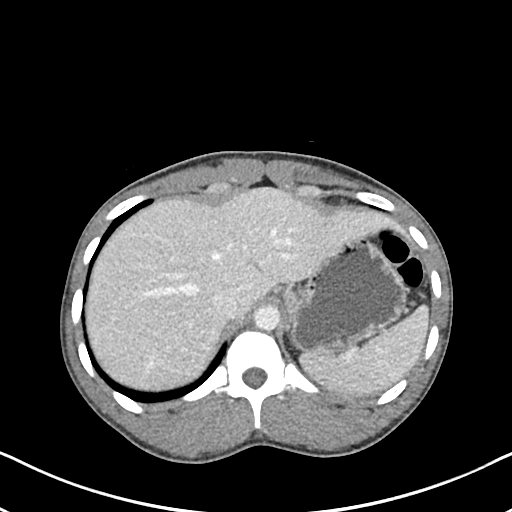
[im 85/90  soft-tissue]
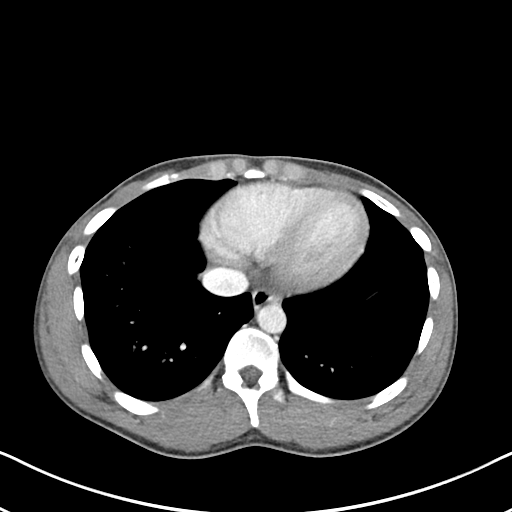

[Series 6: abdomen 3.0 mpr cor · coronal · 0.53mm/px · 3 of 75 slices shown]
[im 25/75  soft-tissue]
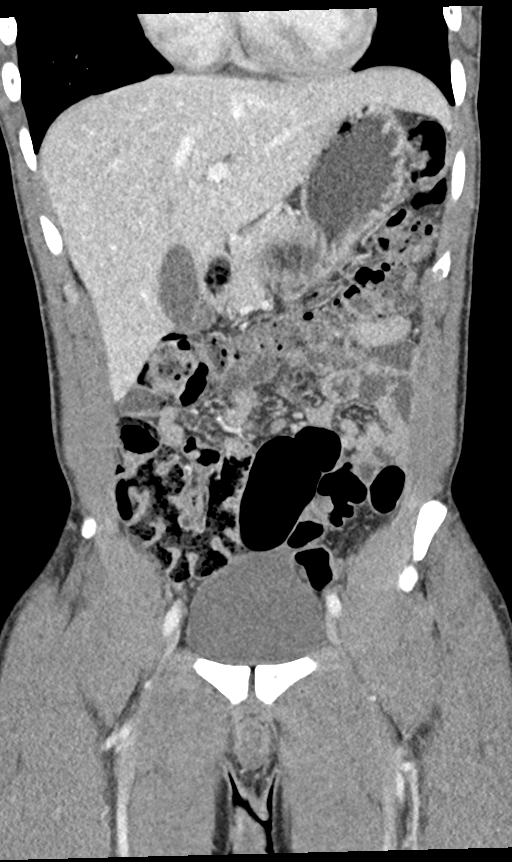
[im 33/75  soft-tissue]
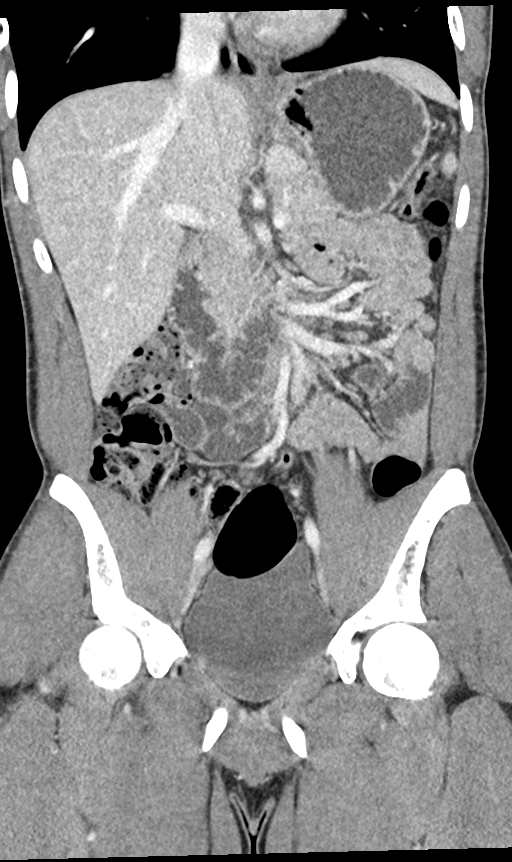
[im 42/75  soft-tissue]
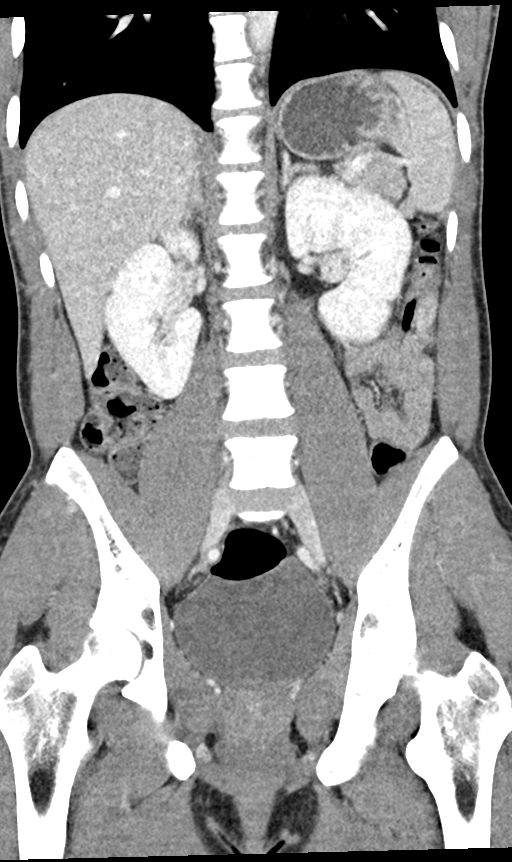

[15 of 46 positions shown; findings below may reference images not displayed]

RADIATION DOSE REDUCTION: This exam was performed according to the
departmental dose-optimization program which includes automated
exposure control, adjustment of the mA and/or kV according to
patient size and/or use of iterative reconstruction technique.

CONTRAST:  100mL OMNIPAQUE IOHEXOL 300 MG/ML  SOLN
FINDINGS: Lower chest: No acute abnormality.

Hepatobiliary: No focal liver abnormality is seen. No gallstones,
gallbladder wall thickening, or biliary dilatation.

Pancreas: Unremarkable. No pancreatic ductal dilatation or
surrounding inflammatory changes.

Spleen: Normal in size without focal abnormality.

Adrenals/Urinary Tract: Adrenal glands are unremarkable. Kidneys are
normal, without renal calculi, focal lesion, or hydronephrosis.
Bladder is unremarkable.

Stomach/Bowel: Stomach is within normal limits. Appendix appears
normal. No evidence of bowel wall thickening, distention, or
inflammatory changes.

Vascular/Lymphatic: No significant vascular findings are present. No
enlarged abdominal or pelvic lymph nodes.

Reproductive: Prostate is unremarkable.

Other: Large right perianal peripherally enhancing abscess
collection measuring up to 5.6 x 3.1 x 5.6 cm in AP, transverse and
craniocaudal dimensions and bulging exophytically.

Musculoskeletal: No acute or significant osseous findings.
IMPRESSION: Large right perianal abscess.
